# Patient Record
Sex: Female | Born: 1965 | Race: White | Hispanic: No | Marital: Married | Smoking: Never smoker
Health system: Southern US, Community
[De-identification: ages and names within clinical notes are randomized; demographics above are authoritative.]

## PROBLEM LIST (undated history)

## (undated) DIAGNOSIS — R109 Unspecified abdominal pain: Secondary | ICD-10-CM

## (undated) HISTORY — PX: ABDOMINAL HYSTERECTOMY: SHX81

## (undated) HISTORY — DX: Unspecified abdominal pain: R10.9

---

## 1998-09-12 ENCOUNTER — Other Ambulatory Visit: Admission: RE | Admit: 1998-09-12 | Discharge: 1998-09-12 | Payer: Self-pay | Admitting: Obstetrics and Gynecology

## 1999-09-14 ENCOUNTER — Other Ambulatory Visit: Admission: RE | Admit: 1999-09-14 | Discharge: 1999-09-14 | Payer: Self-pay | Admitting: Obstetrics and Gynecology

## 1999-12-12 ENCOUNTER — Other Ambulatory Visit: Admission: RE | Admit: 1999-12-12 | Discharge: 1999-12-12 | Payer: Self-pay | Admitting: Physical Therapy

## 1999-12-12 ENCOUNTER — Encounter (INDEPENDENT_AMBULATORY_CARE_PROVIDER_SITE_OTHER): Payer: Self-pay | Admitting: *Deleted

## 2000-01-31 ENCOUNTER — Ambulatory Visit (HOSPITAL_COMMUNITY): Admission: RE | Admit: 2000-01-31 | Discharge: 2000-01-31 | Payer: Self-pay | Admitting: Family Medicine

## 2000-01-31 ENCOUNTER — Encounter (INDEPENDENT_AMBULATORY_CARE_PROVIDER_SITE_OTHER): Payer: Self-pay | Admitting: Specialist

## 2000-07-16 ENCOUNTER — Observation Stay (HOSPITAL_COMMUNITY): Admission: RE | Admit: 2000-07-16 | Discharge: 2000-07-17 | Payer: Self-pay | Admitting: Obstetrics and Gynecology

## 2000-07-16 ENCOUNTER — Encounter (INDEPENDENT_AMBULATORY_CARE_PROVIDER_SITE_OTHER): Payer: Self-pay | Admitting: Specialist

## 2000-07-18 ENCOUNTER — Encounter: Payer: Self-pay | Admitting: Emergency Medicine

## 2000-07-18 ENCOUNTER — Emergency Department (HOSPITAL_COMMUNITY): Admission: EM | Admit: 2000-07-18 | Discharge: 2000-07-18 | Payer: Self-pay | Admitting: Emergency Medicine

## 2001-06-29 ENCOUNTER — Other Ambulatory Visit: Admission: RE | Admit: 2001-06-29 | Discharge: 2001-06-29 | Payer: Self-pay | Admitting: Obstetrics and Gynecology

## 2002-07-11 ENCOUNTER — Emergency Department (HOSPITAL_COMMUNITY): Admission: EM | Admit: 2002-07-11 | Discharge: 2002-07-12 | Payer: Self-pay | Admitting: *Deleted

## 2002-08-16 ENCOUNTER — Other Ambulatory Visit: Admission: RE | Admit: 2002-08-16 | Discharge: 2002-08-16 | Payer: Self-pay | Admitting: Obstetrics and Gynecology

## 2003-05-18 ENCOUNTER — Encounter: Admission: RE | Admit: 2003-05-18 | Discharge: 2003-05-18 | Payer: Self-pay | Admitting: General Surgery

## 2003-05-18 ENCOUNTER — Encounter: Payer: Self-pay | Admitting: General Surgery

## 2003-09-08 ENCOUNTER — Other Ambulatory Visit: Admission: RE | Admit: 2003-09-08 | Discharge: 2003-09-08 | Payer: Self-pay | Admitting: Obstetrics and Gynecology

## 2004-10-04 ENCOUNTER — Other Ambulatory Visit: Admission: RE | Admit: 2004-10-04 | Discharge: 2004-10-04 | Payer: Self-pay | Admitting: Obstetrics and Gynecology

## 2005-02-09 ENCOUNTER — Emergency Department (HOSPITAL_COMMUNITY): Admission: EM | Admit: 2005-02-09 | Discharge: 2005-02-09 | Payer: Self-pay | Admitting: Emergency Medicine

## 2005-03-21 ENCOUNTER — Ambulatory Visit (HOSPITAL_COMMUNITY): Admission: RE | Admit: 2005-03-21 | Discharge: 2005-03-21 | Payer: Self-pay | Admitting: Gastroenterology

## 2005-04-12 ENCOUNTER — Ambulatory Visit (HOSPITAL_BASED_OUTPATIENT_CLINIC_OR_DEPARTMENT_OTHER): Admission: RE | Admit: 2005-04-12 | Discharge: 2005-04-12 | Payer: Self-pay | Admitting: General Surgery

## 2005-08-28 ENCOUNTER — Emergency Department (HOSPITAL_COMMUNITY): Admission: EM | Admit: 2005-08-28 | Discharge: 2005-08-28 | Payer: Self-pay | Admitting: Emergency Medicine

## 2006-10-08 ENCOUNTER — Ambulatory Visit: Payer: Self-pay | Admitting: Family Medicine

## 2006-10-08 LAB — CONVERTED CEMR LAB
Albumin: 4.2 g/dL (ref 3.5–5.2)
Alkaline Phosphatase: 47 units/L (ref 39–117)
BUN: 15 mg/dL (ref 6–23)
Chloride: 102 meq/L (ref 96–112)
GFR calc non Af Amer: 85 mL/min
MCHC: 33.3 g/dL (ref 30.0–36.0)
MCV: 98.5 fL (ref 78.0–100.0)
Platelets: 436 10*3/uL — ABNORMAL HIGH (ref 150–400)
RBC: 4.23 M/uL (ref 3.87–5.11)
RDW: 12.2 % (ref 11.5–14.6)

## 2008-03-25 ENCOUNTER — Encounter: Admission: RE | Admit: 2008-03-25 | Discharge: 2008-03-25 | Payer: Self-pay | Admitting: Obstetrics and Gynecology

## 2009-03-27 ENCOUNTER — Encounter: Admission: RE | Admit: 2009-03-27 | Discharge: 2009-03-27 | Payer: Self-pay | Admitting: Obstetrics and Gynecology

## 2009-03-28 ENCOUNTER — Ambulatory Visit (HOSPITAL_COMMUNITY): Admission: RE | Admit: 2009-03-28 | Discharge: 2009-03-28 | Payer: Self-pay | Admitting: Surgery

## 2009-04-06 ENCOUNTER — Encounter: Admission: RE | Admit: 2009-04-06 | Discharge: 2009-04-06 | Payer: Self-pay | Admitting: Obstetrics and Gynecology

## 2009-06-05 ENCOUNTER — Ambulatory Visit: Payer: Self-pay | Admitting: Internal Medicine

## 2009-06-05 ENCOUNTER — Inpatient Hospital Stay (HOSPITAL_COMMUNITY): Admission: EM | Admit: 2009-06-05 | Discharge: 2009-06-07 | Payer: Self-pay | Admitting: Emergency Medicine

## 2009-08-21 ENCOUNTER — Ambulatory Visit: Payer: Self-pay | Admitting: Family Medicine

## 2009-08-21 DIAGNOSIS — R51 Headache: Secondary | ICD-10-CM

## 2009-08-21 DIAGNOSIS — R519 Headache, unspecified: Secondary | ICD-10-CM | POA: Insufficient documentation

## 2009-08-21 DIAGNOSIS — K270 Acute peptic ulcer, site unspecified, with hemorrhage: Secondary | ICD-10-CM | POA: Insufficient documentation

## 2009-08-21 DIAGNOSIS — K5730 Diverticulosis of large intestine without perforation or abscess without bleeding: Secondary | ICD-10-CM | POA: Insufficient documentation

## 2009-08-23 ENCOUNTER — Telehealth (INDEPENDENT_AMBULATORY_CARE_PROVIDER_SITE_OTHER): Payer: Self-pay | Admitting: *Deleted

## 2009-08-23 LAB — CONVERTED CEMR LAB
BUN: 17 mg/dL (ref 6–23)
Basophils Relative: 0.7 % (ref 0.0–3.0)
CO2: 30 meq/L (ref 19–32)
Calcium: 9.2 mg/dL (ref 8.4–10.5)
Chloride: 105 meq/L (ref 96–112)
Creatinine, Ser: 0.6 mg/dL (ref 0.4–1.2)
Eosinophils Absolute: 0.1 10*3/uL (ref 0.0–0.7)
Glucose, Bld: 89 mg/dL (ref 70–99)
HCT: 39.9 % (ref 36.0–46.0)
Hemoglobin: 13.6 g/dL (ref 12.0–15.0)
MCV: 99.7 fL (ref 78.0–100.0)
Neutrophils Relative %: 52 % (ref 43.0–77.0)
RBC: 4 M/uL (ref 3.87–5.11)
RDW: 12.4 % (ref 11.5–14.6)
WBC: 5 10*3/uL (ref 4.5–10.5)

## 2010-03-21 ENCOUNTER — Encounter: Payer: Self-pay | Admitting: Family Medicine

## 2010-03-21 ENCOUNTER — Encounter (INDEPENDENT_AMBULATORY_CARE_PROVIDER_SITE_OTHER): Payer: Self-pay | Admitting: *Deleted

## 2010-03-21 LAB — CONVERTED CEMR LAB
AST: 21 units/L
Albumin: 4.6 g/dL
BUN: 13 mg/dL
CO2, serum: 27 mmol/L
Chloride, Serum: 104 mmol/L
Creatinine, Ser: 0.7 mg/dL
HCT: 41.1 %
MCH: 32.7 pg
MCV: 98.5 fL
RBC count: 4.17 10*6/uL
Total Protein: 6.8 g/dL
WBC, blood: 6.3 10*3/uL

## 2010-03-23 ENCOUNTER — Encounter: Admission: RE | Admit: 2010-03-23 | Discharge: 2010-03-23 | Payer: Self-pay | Admitting: Gastroenterology

## 2010-03-29 ENCOUNTER — Encounter: Admission: RE | Admit: 2010-03-29 | Discharge: 2010-03-29 | Payer: Self-pay | Admitting: Obstetrics and Gynecology

## 2010-07-05 IMAGING — MG MM SCREEN MAMMOGRAM BILATERAL
4 series · 4 of 4 positions shown · non-contrast
Comparison: Prior studies.

DG SCREEN MAMMOGRAM BILATERAL
Bilateral CC and MLO view(s) were taken.

DIGITAL SCREENING MAMMOGRAM WITH CAD:

[R CC]
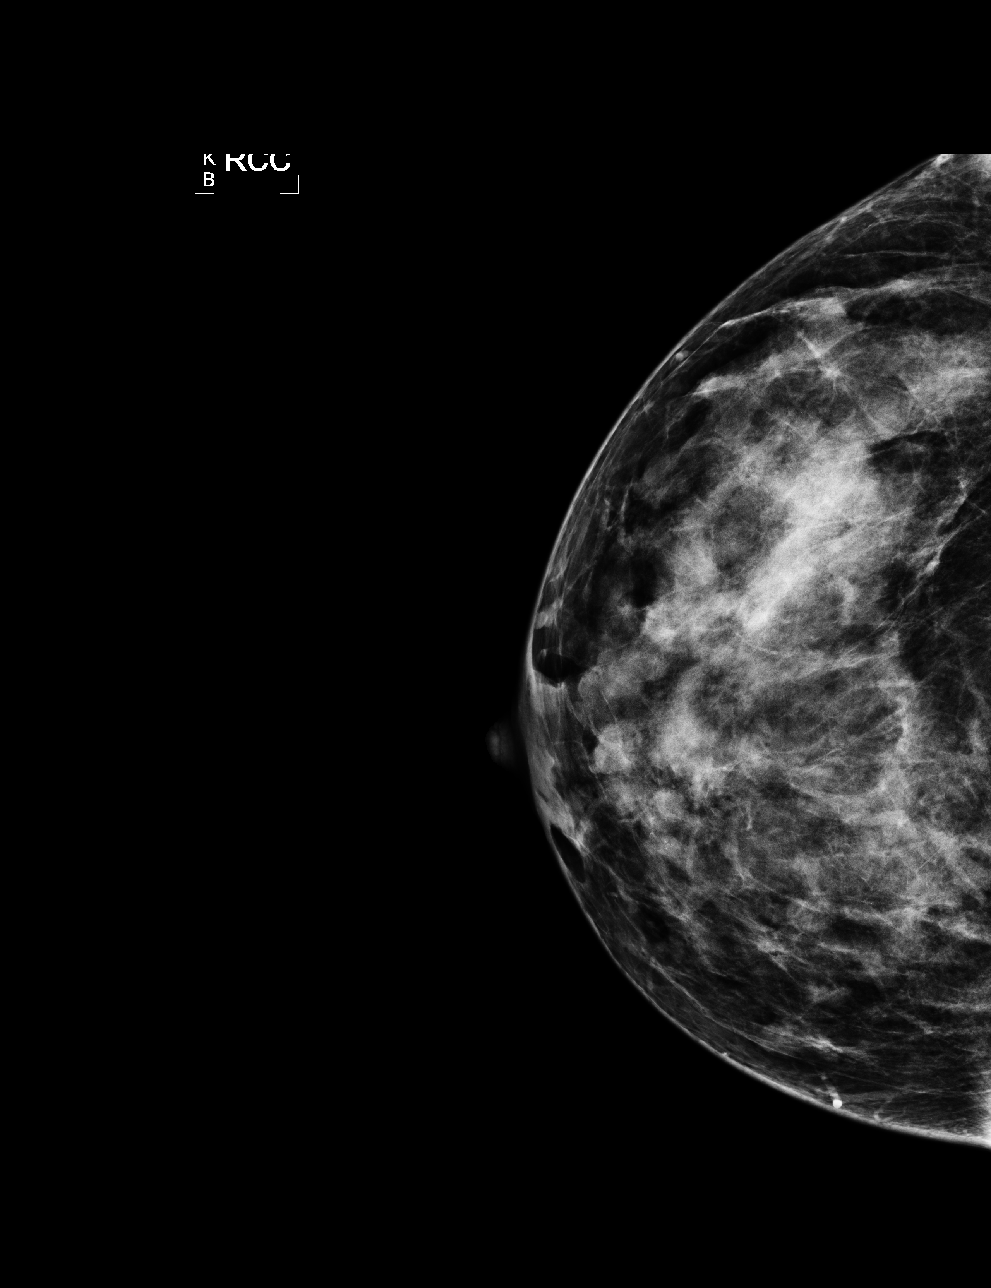

[L CC]
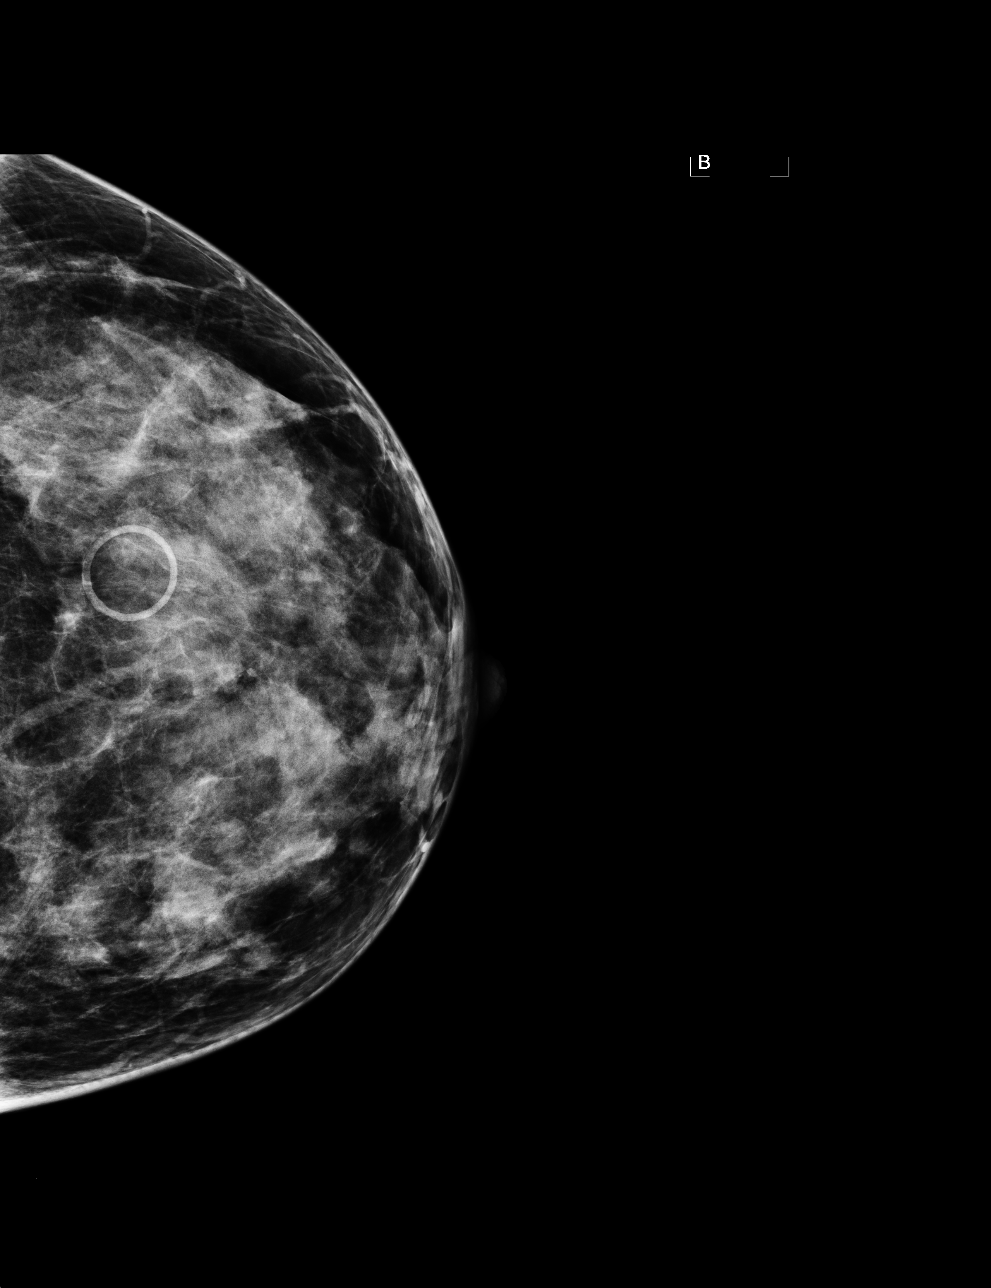

[L MLO]
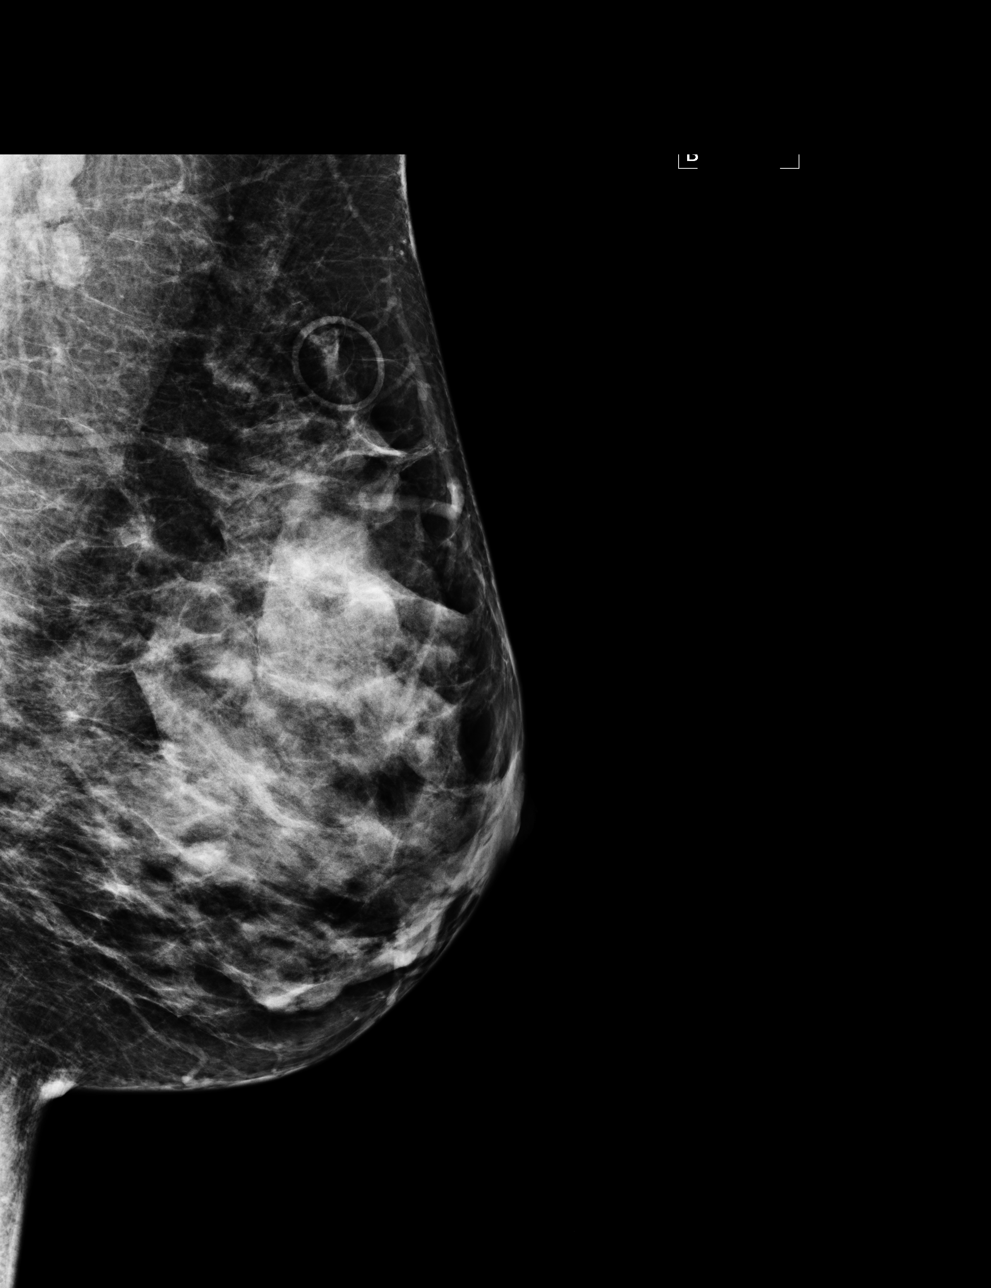

[R MLO]
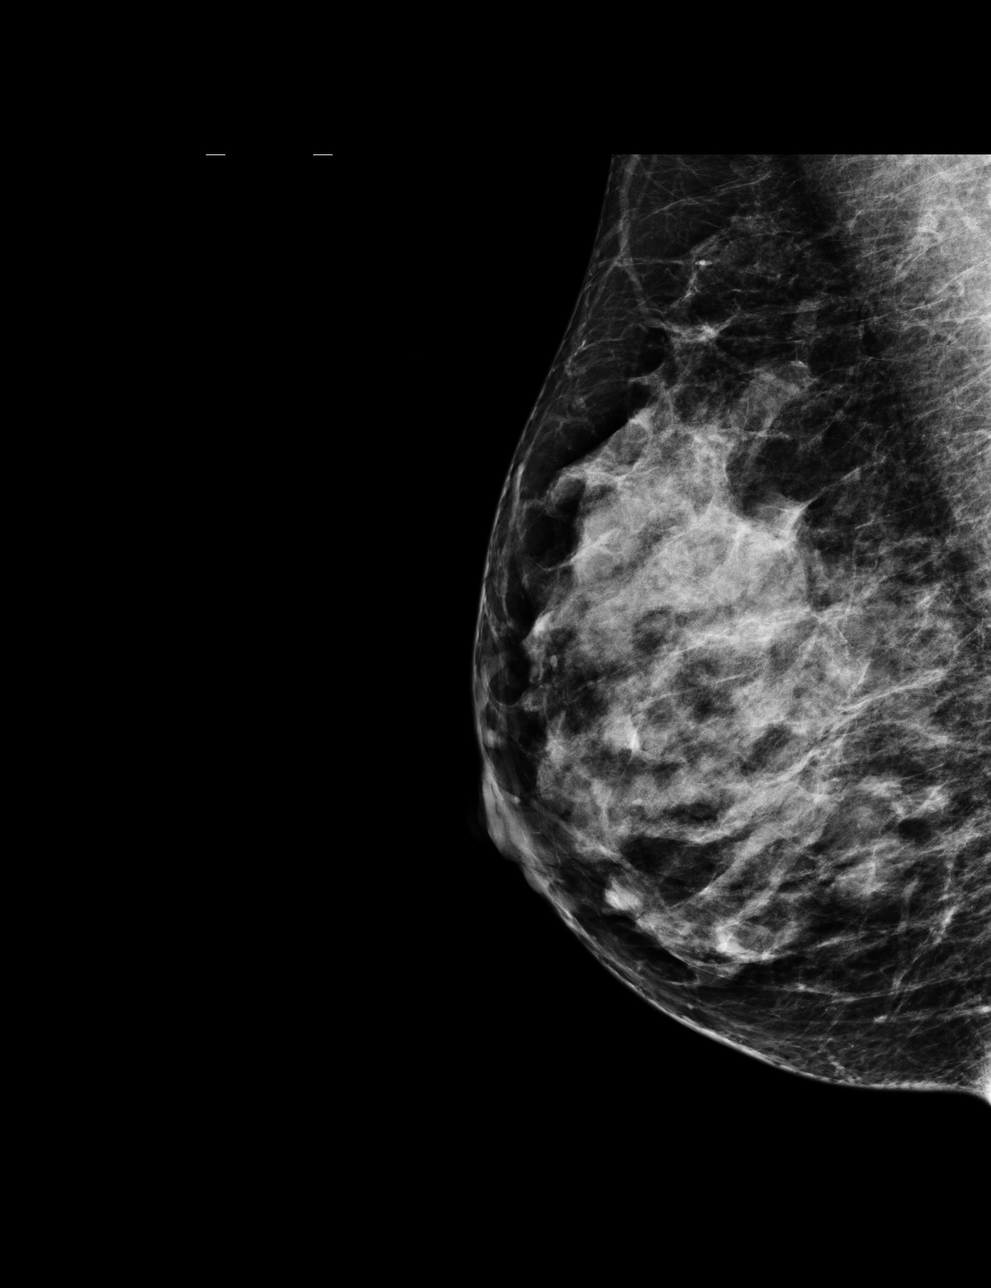

[4 of 4 positions shown; findings below may reference images not displayed]

The breast tissue is heterogeneously dense.  There is no dominant mass, architectural distortion or
calcification to suggest malignancy.  However, on her screening history sheet, the patient reports
a right axillary mass.
IMPRESSION: No mammographic evidence of malignancy.  However, the patient reports a right axillary mass.  She 
will be recalled for further evaluation

ASSESSMENT: Need additional imaging evaluation and/or prior mammograms for comparison - BI-RADS 0 -
Right

Further imaging of the right breast.
ANALYZED BY COMPUTER AIDED DETECTION. , THIS PROCEDURE WAS A DIGITAL MAMMOGRAM.

## 2011-01-24 NOTE — Letter (Signed)
Summary: [facility]  [facility]   Imported By: Lanelle Bal 03/28/2010 12:30:15  _____________________________________________________________________  External Attachment:    Type:   Image     Comment:   External Document

## 2011-01-24 NOTE — Miscellaneous (Signed)
Summary: lab from Dr. Ewing Schlein   Clinical Lists Changes  Observations: Added new observation of BILI TOTAL: 0.7 mg/dL (16/09/9603 54:09) Added new observation of SGPT (ALT): 17 units/L (03/21/2010 15:21) Added new observation of SGOT (AST): 21 units/L (03/21/2010 15:21) Added new observation of PROTEIN, TOT: 6.8 g/dL (81/19/1478 29:56) Added new observation of ALBUMIN: 4.6 g/dL (21/30/8657 84:69) Added new observation of CALCIUM: 9.5 mg/dL (62/95/2841 32:44) Added new observation of GLUCOSE SER: 96 mg/dL (12/25/7251 66:44) Added new observation of CREATININE: 0.70 mg/dL (03/47/4259 56:38) Added new observation of BUN: 13 mg/dL (75/64/3329 51:88) Added new observation of CO2 TOTAL: 27 mmol/L (03/21/2010 15:21) Added new observation of CHLORIDE: 104 mmol/L (03/21/2010 15:21) Added new observation of POTASSIUM: 4.3 mmol/L (03/21/2010 15:21) Added new observation of SODIUM: 138 mmol/L (03/21/2010 15:21) Added new observation of PLATELETS: 332 10*3/mm3 (03/21/2010 15:21) Added new observation of MCH: 32.7 pg (03/21/2010 15:21) Added new observation of MCV: 98.5 fL (03/21/2010 15:21) Added new observation of HCT: 41.1 % (03/21/2010 15:21) Added new observation of HGB: 13.4 g/dL (41/66/0630 16:01) Added new observation of RBC: 4.17 10*6/mm3 (03/21/2010 15:21) Added new observation of WBC: 6.3 10*3/mm3 (03/21/2010 15:21)

## 2011-04-01 LAB — DIFFERENTIAL
Basophils Absolute: 0 10*3/uL (ref 0.0–0.1)
Basophils Relative: 0 % (ref 0–1)
Eosinophils Relative: 2 % (ref 0–5)
Lymphocytes Relative: 32 % (ref 12–46)
Monocytes Absolute: 0.6 10*3/uL (ref 0.1–1.0)
Neutro Abs: 6.7 10*3/uL (ref 1.7–7.7)

## 2011-04-01 LAB — URINALYSIS, ROUTINE W REFLEX MICROSCOPIC
Ketones, ur: NEGATIVE mg/dL
Nitrite: NEGATIVE
Protein, ur: NEGATIVE mg/dL
Specific Gravity, Urine: 1.028 (ref 1.005–1.030)
Urobilinogen, UA: 0.2 mg/dL (ref 0.0–1.0)
pH: 5 (ref 5.0–8.0)

## 2011-04-01 LAB — VITAMIN B12: Vitamin B-12: 550 pg/mL (ref 211–911)

## 2011-04-01 LAB — BASIC METABOLIC PANEL
CO2: 27 mEq/L (ref 19–32)
CO2: 26 mEq/L (ref 19–32)
Calcium: 7.7 mg/dL — ABNORMAL LOW (ref 8.4–10.5)
Chloride: 110 mEq/L (ref 96–112)
Creatinine, Ser: 0.59 mg/dL (ref 0.4–1.2)
Creatinine, Ser: 0.58 mg/dL (ref 0.4–1.2)
GFR calc Af Amer: >60 mL/min (ref >60)
GFR calc non Af Amer: >60 mL/min (ref >60)
Glucose, Bld: 91 mg/dL (ref 70–99)
Sodium: 137 mEq/L (ref 135–145)

## 2011-04-01 LAB — VITAMIN D 1,25 DIHYDROXY
Vitamin D 1, 25 (OH)2 Total: 84 pg/mL — ABNORMAL HIGH (ref 18–72)
Vitamin D2 1, 25 (OH)2: <8 pg/mL
Vitamin D3 1, 25 (OH)2: 84 pg/mL

## 2011-04-01 LAB — COMPREHENSIVE METABOLIC PANEL
AST: 18 U/L (ref 0–37)
Albumin: 4.1 g/dL (ref 3.5–5.2)
Alkaline Phosphatase: 40 U/L (ref 39–117)
BUN: 16 mg/dL (ref 6–23)
CO2: 27 mEq/L (ref 19–32)
Chloride: 105 mEq/L (ref 96–112)
Creatinine, Ser: 0.68 mg/dL (ref 0.4–1.2)
GFR calc non Af Amer: >60 mL/min (ref >60)
Potassium: 4.3 mEq/L (ref 3.5–5.1)
Total Bilirubin: 0.5 mg/dL (ref 0.3–1.2)

## 2011-04-01 LAB — CBC
HCT: 34.9 % — ABNORMAL LOW (ref 36.0–46.0)
HCT: 39.7 % (ref 36.0–46.0)
Hemoglobin: 13.9 g/dL (ref 12.0–15.0)
MCHC: 34.3 g/dL (ref 30.0–36.0)
MCHC: 34.4 g/dL (ref 30.0–36.0)
MCHC: 35.1 g/dL (ref 30.0–36.0)
Platelets: 225 10*3/uL (ref 150–400)
RDW: 12.8 % (ref 11.5–15.5)
WBC: 5.9 10*3/uL (ref 4.0–10.5)
WBC: 11.1 10*3/uL — ABNORMAL HIGH (ref 4.0–10.5)

## 2011-04-01 LAB — GLIADIN ANTIBODIES, SERUM: Gliadin IgG: 0.7 U/mL (ref <7)

## 2011-04-01 LAB — LIPASE, BLOOD: Lipase: 22 U/L (ref 11–59)

## 2011-04-01 LAB — TSH: TSH: 1.365 u[IU]/mL (ref 0.350–4.500)

## 2011-04-01 LAB — H. PYLORI ANTIBODY, IGG: H Pylori IgG: <0.4 {ISR}

## 2011-04-03 LAB — HEMOGLOBIN AND HEMATOCRIT, BLOOD
HCT: 40 % (ref 36.0–46.0)
Hemoglobin: 13.5 g/dL (ref 12.0–15.0)

## 2011-05-07 NOTE — Consult Note (Signed)
NAMEWENDIE, DISKIN                ACCOUNT NO.:  [id number]   MEDICAL RECORD NO.:  [id number]          PATIENT TYPE:  INP   LOCATION:  1534                         FACILITY:  [facility]   PHYSICIAN:  Wilmon Arms. Corliss Skains, M.D. DATE OF BIRTH:  Oct 16, 1966   DATE OF CONSULTATION:  06/05/2009  DATE OF DISCHARGE:                                 CONSULTATION   REASON FOR CONSULTATION:  Possible partial small bowel obstruction.   HISTORY OF PRESENT ILLNESS:  This is a 45 year old female in good health  who presents with several days of worsening epigastric abdominal pain.  The patient has extensive past surgical history including a laparoscopic-  assisted sigmoid colectomy in 2006 for diverticulitis, an open ventral  hernia repair at her umbilical port site in 2006, and a laparoscopic  ventral hernia repair with mesh in April of 2010 by Dr. Michaell Cowing.  At that  time, he used a 15 x 20-cm sheet of mesh.  Since surgery she has had  pain in the upper part of her abdomen around the laparoscopic sites.  She has also been having some constipation.  However, over the last  several days, she has had worsening pain and distention.  Her last bowel  movement was yesterday.  She had 1 episode of vomiting today.  She does  feel bloated and distended.  The patient reports that she has not been  on narcotics for some time but has been taking larger than usual doses  of ibuprofen 600 mg every 5 or 6 hours for the last several weeks.  She  denies any hematemesis or hematochezia.  No melena.  She presented to  the emergency department for evaluation.  They obtained a CT scan which  is described below.   PAST MEDICAL HISTORY:  Diverticulitis.   PAST SURGICAL HISTORY:  1. D and C and diagnostic laparoscopy.  2. C-section, 1993.  3. Tubal ligation.  4. Laparoscopic sigmoid colectomy, 2006.  5. Ventral hernia repair, 2006.  6. Laparoscopic ventral hernia repair with mesh, 2010.   ALLERGIES:  1. PENICILLIN.  2.  CIPRO.   CURRENT MEDICATIONS:  None.   PHYSICAL EXAMINATION:  Temp 97.3.  Pulse 75.  Blood pressure 108/69.  This is a well-developed, well-nourished female in no apparent distress.  HEENT:  EOMI.  Sclerae icteric.  NECK:  No mass.  No thyromegaly.  LUNGS:  Clear.  Normal respiratory effort.  HEART:  Regular rate and rhythm.  No murmur.  ABDOMEN:  Positive bowel sounds.  Soft, mildly distended, tender in the  epigastrium.  No rebound or guarding.  No palpable masses.   LABS:  White count 11.1, hemoglobin 13.9.  Electrolytes within normal  limits.  Lipase is normal.  CT scan showed some mildly dilated proximal  small bowel with decompressed distal small bowel.  There is also  mesenteric edema.   IMPRESSION:  Early partial small bowel obstruction versus ileus  secondary to gastroenteritis.  Her pain ultimately may be secondary to  gastritis or peptic ulcer disease secondary to heavy recent NSAID use.   RECOMMENDATIONS:  1. Bowel rest.  2.  IV Protonix.  3. Carafate.  4. No need for NG tube at this time.  5. We will recheck labs and films in the morning.      Wilmon Arms. Tsuei, M.D.  Electronically Signed     MKT/MEDQ  D:  06/05/2009  T:  06/05/2009  Job:  [id number]   cc:   Rosalyn Gess. Norins, MD  [address]  [place]  [place] [address]   Ardeth Sportsman, MD  [address] [place] [address]

## 2011-05-07 NOTE — Discharge Summary (Signed)
Tara Banks, Tara Banks                ACCOUNT NO.:  [id number]   MEDICAL RECORD NO.:  [id number]          PATIENT TYPE:  INP   LOCATION:  1534                         FACILITY:  [facility]   PHYSICIAN:  Georgina Quint. Plotnikov, MDDATE OF BIRTH:  06-19-1966   DATE OF ADMISSION:  06/05/2009  DATE OF DISCHARGE:  06/07/2009                               DISCHARGE SUMMARY   DISCHARGE MEDICATIONS:  1. Protonix 40 mg one twice a day.  2. Tramadol 50 mg one to two p.o. b.i.d. p.r.n. pain.  3. Promethazine 25 mg one p.o. b.i.d. p.r.n. nausea.  4. Iron sulfate 325 mg one p.o. daily for 1 month.   FOLLOWUP PLANS:  1. Dr. Beverely Low in 3 - 7 days with labs prior (CBC).  2. Dr. Ewing Schlein in 1 - 2 weeks.   SPECIAL INSTRUCTIONS:  Call if problems.  Do not take ibuprofen or other  anti-inflammatory medications.   CONSULTATIONS:  Surgery - Dr. Corliss Skains and Dr. Michaell Cowing.   PROCEDURES:  CT scan of the abdomen.   HISTORY:  The patient is a 45 year old female who was admitted for  worsening epigastric abdominal pain.  The pain was severe.  She was  constipated and nauseated.  She was admitted for early stages of small  bowel obstruction.  For the details please address to our history and  physical and Dr. Gordy Savers and Dr. Fatima Sanger consultation note.  Of note,  the patient had a large dark stool a couple days prior to her admission  that she referred to red wine consumption on the day prior.   Of note, prior to admission she was consuming 9 - 12 Advil a day for  epigastric pain, thought to be a pain from mesh placement for hernia  repair.   HOSPITAL COURSE:  During the course of hospitalization the patient was  treated with IV fluids, we progressed with diet slowly.  She was seen in  consultation by Surgery.  She had to use an enema to relieve  constipation.  The first stool was very large and dark.  On the day of  discharge she is feeling much better.   She is afebrile, her blood pressure is 90/59, heart rate 70,  respirations 18, temperature 98.2, T-max 98.4, sats 100% on room air.  She is in no acute distress.  HEENT:  Moist mucosa.  NECK:  Supple.  LUNGS:  Clear.  HEART:  Regular.  ABDOMEN:  Soft, sensitive/tender in the epigastric area.  No masses, no  rebound symptoms.  LOWER EXTREMITIES:  Without edema.  SKIN:  Clear.  She is alert and oriented, cooperative.   DISCHARGE DIAGNOSES:  1. Abdominal pain/early small-bowel obstruction/ileus, likely due to      problem #2 and #3.  2. Probable gastritis versus steroid-induced ulcer with upper      gastrointestinal bleeding.  3. Status post multiple abdominal surgeries.  4. Mild anemia due to the above.  5. Left ovarian cyst with small to moderate pelvic fluid on CT scan.   DISCHARGE DIET:  Discussed with the patient:  eat small portions.  Fluids.  She may need  to try a gluten-free diet in view of her chronic  GI concerns.  She will discuss it further with Dr. Ewing Schlein.   LAB WORK:  CT of the abdomen with left ovarian cyst, small to moderate  pelvic fluid, SBO with possible enteritis.  Hemoglobin 10.9, was 13.9.  White cell count 6.7, was 11.1.  Platelets 225, sodium 137, potassium  3.8, creatinine 0.58.  Urine pregnancy test negative.  Liver function  tests, urinalysis normal.  Abdominal x-ray on June 15 with one loop of  slightly dilated bowel, otherwise improved from previous CAT scan.  A  number of tests including TSH, vitamin B12, vitamin D, antigliadin  antibodies, H. pylori are still pending.      Georgina Quint. Plotnikov, MD  Electronically Signed     AVP/MEDQ  D:  06/07/2009  T:  06/07/2009  Job:  [id number]   cc:   Neena Rhymes, M.D.   Petra Kuba, M.D.  Fax: [phone]   Lincoln Maxin  Fax: [phone]

## 2011-05-07 NOTE — Op Note (Signed)
Tara Banks, Tara Banks                ACCOUNT NO.:  [id number]   MEDICAL RECORD NO.:  [id number]          PATIENT TYPE:  AMB   LOCATION:  DAY                          FACILITY:  [facility]   PHYSICIAN:  Ardeth Sportsman, MD     DATE OF BIRTH:  Mar 16, 1966   DATE OF PROCEDURE:  DATE OF DISCHARGE:                               OPERATIVE REPORT   PRIMARY CARE PHYSICIAN:  Lenoard Aden, M.D.   GASTROENTEROLOGIST:  Petra Kuba, M.D.   SURGEON:  Ardeth Sportsman, MD.   ASSISTANT:  Felicity Pellegrini.   PREOPERATIVE DIAGNOSIS:  Recurrent ventral hernia.   POSTOPERATIVE DIAGNOSIS:  Recurrent ventral hernia.   PROCEDURES PERFORMED:  1. Laparoscopic lysis of adhesions.  2. Laparoscopic ventral hernia repair with primary laparoscopic      closure and underlay repair with mesh.   ANESTHESIA:  1. General anesthesia.  2. Local anesthetic and a field block around port sites.   SPECIMENS:  None.   DRAINS:  None.   ESTIMATED BLOOD LOSS:  5 mL.   COMPLICATIONS:  None apparent.   INDICATIONS:  Ms. Berko is a pleasant 42-year female who developed a  periumbilical incisional hernia from a partial colectomy for  diverticulitis.  She had a primary repair done by Dr. Lurene Shadow in our  group in 2007.  She has developed a recurrence.  She had CT scan showing  small bowel herniating up into the area.  Based on concern, surgical  consultation was requested and repair was requested.   Anatomy and embryology of abdominal formation was discussed.  Pathophysiology of herniation with its natural history and risks were  discussed.  Options discussed and recommendations made for diagnostic  laparoscopy with lysis of adhesions and a ventral hernia repair with  probable primary closure then underlay mesh repair.  Risks, benefits,  alternatives, questions answered and she has agreed to see.   OPERATIVE FINDINGS:  She had a 4- x 5-cm periumbilical hernia.  I saw  old stitch there, but no definite mesh could be seen  from the peritoneal  side.  There was omentum within it and a loop of small bowel nearby, but  no incarceration.  The rest of the abdominal cavity appeared to be  normal.   DESCRIPTION OF PROCEDURE:  Informed consent was confirmed.  The patient  voided just prior to coming to the operating room.  She had sequential  pressure devices active during the entire case.  She received IV  antibiotics just prior to surgery.  She underwent general anesthesia  without any difficulty.  She was positioned supine with both arms tucked  on a foot board.  Her abdomen was prepped and draped in sterile fashion.   A 5-mm port was placed in the left upper quadrant using optical entry  technique with the patient in steep reverse Trendelenburg and left side  up.  Under direct visualization, 5-mm ports were placed in the right  flank and left lower quadrant.  A 12-mm port was placed in the left  flank.   Diagnostic laparoscopy revealed findings noted above.  Lambert Mody  cold  scissors careful blunt dissection were used help free the omental  attachments on the anterior abdominal wall and expose the hernia defect.  The mesh was measured.   The defect was closed using 0 Vicryl interrupted stitches x5 in a  transverse fashion.  These were tied down with releasing the  capnoperitoneum to help tie them down without tension and reinsufflated.  This helped provide good primary transverse closure.   Given the mesh options, ideally 15- x 15-cm mesh would be used but there  was done available so I chose a 15- x 20-cm mesh, placing 12 #1 Novofil  stitches on the rough side of the mesh around its borders.  The mesh was  rolled up and placed through the 12-cm left flank fascial defect and  unrolled.  It was tacked to the anterior abdominal wall such that the  ellipse was vertical and came suprapubically on the most inferior  aspect.  The laparoscopic suture passer was used to do transfascial  fixation x12.  The fascial  stitches were tied down, and reinsufflation  provided good overlapping mesh coverage.  Tacker was used to tack the  rough edges of the mesh peripherally and a couple centrally as well.   Camera inspection revealed no intra-abdominal injury with excellent  coverage.  The 12-mm fascial defect was reapproximated using 0 Vicryl  stitch using laparoscopic suture passer to good result.  Capnoperitoneum  was evacuated and ports removed.  Fascial stitches on the port site was  closed down.  Skin was closed using 4-0 Monocryl stitch.  The smaller  puncture sites for the transfascial stitches were closed using Tegaderm  and Steri-Strips.   The patient was extubated and sent to the recovery room in stable  condition.   I explained postoperative care with the patient in detail just prior to  surgery as well as discussed it in the office when I first met her.  I  am about to discuss it with her family as well.      Ardeth Sportsman, MD  Electronically Signed     SCG/MEDQ  D:  03/28/2009  T:  03/28/2009  Job:  [id number]   cc:   Lenoard Aden, M.D.  Fax: [phone]   Petra Kuba, M.D.  Fax: [phone]

## 2011-05-10 NOTE — Op Note (Signed)
Tara Banks, Tara Banks                ACCOUNT NO.:  [id number]   MEDICAL RECORD NO.:  [id number]          PATIENT TYPE:  AMB   LOCATION:  DSC                          FACILITY:  [facility]   PHYSICIAN:  Leonie Man, M.D.   DATE OF BIRTH:  12/11/1966   DATE OF PROCEDURE:  04/12/2005  DATE OF DISCHARGE:  04/12/2005                                 OPERATIVE REPORT   PREOPERATIVE DIAGNOSIS:  Incisional hernia.   POSTOPERATIVE DIAGNOSIS:  Incisional hernia.   PROCEDURE:  Repair of umbilical incisional hernia with mesh.   SURGEON:  Leonie Man, M.D.   ASSISTANT:  OR RN.   ANESTHESIA:  General.   SPECIMENS:  No specimens sent to the lab.   ESTIMATED BLOOD LOSS:  Minimal.   COMPLICATIONS:  No operative complications.   INDICATION:  The patient is a 45 year old woman who underwent a laparoscopic  sigmoid colectomy at [facility] by Dr. Lawanda Cousins.  She has subsequently  developed a trocar site hernia at the umbilical site.  She comes now to the  operating room for repair this hernia.  The risks and potential benefits of  surgery have been fully discussed with the patient, all questions answered  and consent obtained.   PROCEDURE:  With the patient positioned supinely, the abdomen is prepped and  draped routinely.  The infraumbilical incision is carried down around the  umbilicus and the old scar cicatrix; this is deepened through the skin and  subcutaneous tissue and elevating the umbilicus cephalad.  The incarcerated  mass is dissected free from off of the posterior wall of the posterior  umbilical skin and amputated and discarded.  The hernial defect is then  freed from all adhesions and a Bard mesh plug is placed into the defect and  sutured down to the surrounding fascia with interrupted 0 Novofil sutures.  Repair is noted to be intact.  Sponge and instrument counts verified, the  subcutaneous tissues are closed with interrupted 2-0 Vicryl sutures, the  skin closed with a 4-0  Monocryl suture and then reinforced with Steri-  Strips, sterile dressings applied, anesthetic reversed, patient removed from  the operating room to the recovery room in stable condition.  She tolerated  the procedure well.    PB/MEDQ  D:  05/15/2005  T:  05/16/2005  Job:  [id number]

## 2011-05-10 NOTE — H&P (Signed)
[facility]  Patient:    Tara Banks, Tara Banks                         MRN: [id number] Adm. Date:  07/15/00 Attending:  Trevor Iha, M.D.                         History and Physical  DATE OF BIRTH:  [birth year]  HISTORY OF PRESENT ILLNESS:  The patient is a 45 year old gravida 3, para 3, with worsening pelvic pain and dyspareunia.  It is not relieved with anti-inflammatory medication.  She describes cramping at all times and chronic pelvic pain at all times, worse with intercourse.  Again, it is not improved with birth control pills or anti-inflammatory medication.  She presently has undergone laparoscopy without improvement.  The patient also has had evaluation by gastroenterologist without significant benefit.  She presents today for laparoscopically assisted vaginal hysterectomy.  She had no future child bearing plans.  She has undergone sterilization as had her husband.  She does have minimal stress urinary incontinence and minimal pelvic relaxation symptoms.  PAST MEDICAL HISTORY:  Negative.  PAST SURGICAL HISTORY:  Significant for cesarean section in 1993, a D&C in 1985, laparoscopy in December of 2000, and a postpartum tubal after her last delivery.  MEDICATIONS:  At this time, she is on no medications other anti-inflammatory medication.  ALLERGIES:  SHE HAS SENSITIVITY TO PENICILLIN WHICH CAUSES VOMITING.  PHYSICAL EXAMINATION:  VITAL SIGNS:  Blood pressure is 98/52, weight is 114.  HEART:  Regular rate and rhythm.  LUNGS:  Clear to auscultation bilaterally.  ABDOMEN:  Nondistended and nontender.  Uterus is anteverted, mobile, slightly increased in size.  Moderately tender to deep palpation.  No adnexal masses are palpable.  Ultrasound in December showed 7.9 cm uterus, normal appearing ovaries. Laparoscopy in December showed a somewhat boggy appearing uterus, normal appearing tubes with the exception of a tubal ligation.  Normal  right and left ovaries.  Normal uterosacral ligaments no obvious endometriosis.  Again, the _________ at that time was some colonic nodules which later returned to be polyps, and somewhat enlarged boggy uterus.  IMPRESSION AND PLAN:  Pelvic pain and dyspareunia, improved with conservative medical management including anti-inflammatory medications, oral contraceptive agents with a laparoscopy for evaluation by colonoscopy.  The patient desires to do the surgical intervention and request hysterectomy.  Discussed risks and benefits at length including, but not limited to risks of infection, bleeding, damage to bowel, bladder, and ovaries, the possibility of not able to alleviate the pain.  I had an extensive discussion with the patient with regards to bladder repair types of surgeries, and at this time, the patient does not desire any kind of bladder procedure, and describes her urinary incontinence as very mild.  Again, we will plan laparoscopically assisted vaginal hysterectomy.  Informed consent was obtained. DD:  07/15/00 TD:  07/15/00 Job: [id number]

## 2011-05-10 NOTE — Discharge Summary (Signed)
[facility]  Patient:    Tara Banks, Tara Banks                         MRN: [id number] Adm. Date:  [id number] Disc. Date: [id number] Attending:  Trevor Iha                           Discharge Summary  HISTORY OF PRESENT ILLNESS:  Ms. Michelin is a 45 year old with worsening dyspareunia and pelvic pain, not responsive to conservative medical management including oral contraceptive agents, anti-inflammatory medications, laparoscopy or even GI evaluation.  She presents for laparoscopically assisted vaginal hysterectomy.  Risks and benefits were discussed at length and informed consent was obtained for this.  See history and physical for further details.  HOSPITAL COURSE:  Patient was admitted the same day of surgery and underwent a laparoscopically assisted vaginal hysterectomy.  The hysterectomy was uncomplicated.  Estimated blood loss during the procedure was 200 cc.  The findings at the time of surgery were a slightly enlarged uterus which was boggy in nature; otherwise, normal-appearing ovaries, tubes, appendix and gallbladder.  Postoperative course was complicated only by borderline low blood pressures which responded to IV fluids and discontinuing the morphine. Postoperative day #1, she did have an allergic reaction to Percocet with generalized malaise and itching that responded to Benadryl but after discontinuing the Percocet, patient was ambulated without difficulty and tolerated a regular diet.  Physical exam was consistent with clean, dry and intact incisions, bowel sounds were positive and because the patient was doing well, we went ahead and discharged her home.  DISPOSITION:  Patient will be discharged home with followup in the office in two weeks.  DISCHARGE INSTRUCTIONS:  She was given a prescription for Darvocet-N, #30; she will also continue the Anaprox double-strength at home and return for any increased vaginal bleeding, worsening of her  pain or if she feels like she is not improving like she should.  DISCHARGE DIAGNOSES: 1. Dyspareunia and pelvic pain, pathology is current pending. 2. Status post laparoscopically assisted vaginal hysterectomy.  DISCHARGE CONDITION:  Good. DD:  07/17/00 TD:  07/18/00 Job: [id number]

## 2011-05-10 NOTE — Op Note (Signed)
[name], Tara Banks                ACCOUNT NO.:  [id number]   MEDICAL RECORD NO.:  [id number]          PATIENT TYPE:  AMB   LOCATION:  ENDO                         FACILITY:  [facility]   PHYSICIAN:  Petra Kuba, M.D.    DATE OF BIRTH:  02/13/1966   DATE OF PROCEDURE:  03/21/2005  DATE OF DISCHARGE:                                 OPERATIVE REPORT   PROCEDURE:  Colonoscopy.   ENDOSCOPIST:  Petra Kuba, M.D.   INDICATIONS:  Abdominal pain, history of previous surgery, abnormal CAT  scan.  Consent was signed after risks, benefits, methods, and options were  thoroughly discussed in the office in the past.   MEDICINES USED:  Demerol 80, Versed 8.   PROCEDURE:  Rectal inspection is pertinent for external hemorrhoids, small.  Digital exam was negative. The video pediatric adjustable colonoscope was  inserted and easily advanced around the colon to the cecum and advanced into  a normal terminal ileum.  The cecum was identified by the appendiceal  orifice and ileocecal valve.  Photo documentation was obtained.  To advance  this far did not require any abdominal pressure or any position changes. No  abnormality was seen on insertion. The scope was slowly withdrawn.  The prep  was adequate.  There was minimal liquid stool that required washing and  suction,  On slow withdrawal through the colon, there was a rare left-sided  early diverticula but no polyps, tumors, masses, or other abnormalities.  She did have a fairly low sigmoid anastomosis which was normal.  The scope  was further withdrawn.  Anorectal pull-through and retroflexion in the  rectum were pertinent for some internal hemorrhoids, small.  The scope was  straightened a re-advanced a short ways up the left side of the colon.  Air  was suctioned and the scope removed. The patient tolerated the procedure  well.  There was no obvious immediate complication.   ENDOSCOPIC DIAGNOSIS:  1.  Internal and external hemorrhoids.  2.   Sigmoid anastomosis.  3.  Tiny early rare left-sided diverticula.  4.  Otherwise, within normal limits to the terminal ileum.   PLAN:  Happy to see back p.r.n., repeat screening at age 89, return care to  Dr. Rana Snare and Dr. Lurene Shadow for further workup and plan.      MEM/MEDQ  D:  03/21/2005  T:  03/21/2005  Job:  [id number]   cc:   Dineen Kid. Rana Snare, M.D.  [address]  [place]  [place] [address]  Fax: [phone]   Leonie Man, M.D.  [address]  [address]  [place]  [place] [address]

## 2011-05-10 NOTE — Op Note (Signed)
[facility]  Patient:    Tara Banks, Tara Banks                         MRN: [id number] Proc. Date: 07/16/00 Adm. Date:  [id number] Disc. Date: [id number] Attending:  Trevor Iha                           Operative Report  PREOPERATIVE DIAGNOSIS:  Dyspareunia, pelvic pain.  POSTOPERATIVE DIAGNOSIS:  Dyspareunia, pelvic pain.  OPERATION PERFORMED:  Laparoscopically assisted vaginal hysterectomy.  SURGEON:  Trevor Iha, M.D.  ASSISTANT:  Freddy Finner, M.D.  ANESTHESIA:  General endotracheal.  INDICATIONS FOR PROCEDURE:  Tara Banks is a 45 year old G3, P3 with chronic pelvic pain not responsive to conservative medical management, no improvement with oral contraceptive agents, inflammatory medications.  No benefit after a laparoscopy or GI evaluation by colonoscopy.  Desires definitive surgical intervention with gross hysterectomy.  Both she and her husband are already sterilely, so no future child-rearing desirous.  PLAN:   Laparoscopically assisted vaginal hysterectomy and preservation of the ovaries.  The risks and benefits were discussed.  Informed consent was obtained.  FINDINGS:  Slightly enlarged, boggy appearing uterus.  Normal-appearing ovaries, tubes, appendix and gallbladder and liver.  Otherwise no evidence of endometriosis.  ESTIMATED BLOOD LOSS:  200 cc.  DESCRIPTION OF PROCEDURE:  After adequate general anesthesia, the patient was placed in dorsal lithotomy position.  She was sterilely prepped and draped. The bladder was sterilely drained.  A 1 cm infraumbilical skin incision was made.  Veress needle was inserted.  The abdomen was insufflated to dullness to percussion.  Two 5 mm ports were placed to the left and right of midline through a previous Pfannenstiel skin incision under direct visualization and the above findings were noted.  A 5 mm camera was placed through 5 mm ports and an Endo GIA was placed across the left  utero-ovarian ligaments with good placement noted.  The second application of the Endo GIA on the left side cut and stapled the utero-ovarian ligament down across the round ligaments.  This was done similarly on the right side with two applications of the Endo GIA with good placement noted by camera and the ovaries had fallen to the side with good hemostasis and was taken down past the round ligament.  At this time the legs were elevated for the vaginal portion of the procedure.  The cervix was grasped with Tara Banks tenaculum.  A posterior colpotomy was performed and the cervix was circumscribed with Bovie cautery.  The uterosacral ligaments were clamped, cut and tied bilaterally using 0 Monocryl sutures.  Bladder pillars were then clamped, cut and tied bilaterally and using 0 Monocryl throughout the procedure.  At this time the bladder was dissected off the anterior surface of the cervix.  The anterior peritoneum was entered and placed behind the Deaver retractor. Successive bites across the cardinal ligaments, across the uterine vascular up to the round ligament, bilateral with Heaney clamps and 0 Monocryl.  At this time the uterine fundus was grasped with a thyroid tenaculum and delivered into the introitus and Heaney clamps were placed across the remaining portion of the broad ligament, cut.  The uterus was then removed, sent to pathology and the remaining pedicles were doubly ligated with 0 Monocryl.  Good hemostasis was achieved. At this point the wet pack was placed.  The uterosacral ligaments were identified and suture ligated  bilaterally.  The posterior peritoneum was then closed with a pursestring suture of 0 Monocryl and the vagina was closed in a vertical fashion with figure-of-eights of 0 Monocryl with plication of the uterosacral ligament to the midline.  The anterior vagina was then closed after the packing was removed, again in a vertical fashion with figure-of-eights and 0  Monocryl with good approximation and good hemostasis. At this time the Foley catheter was reinserted with clear urine returned. Re-examination by laparoscope revealed good hemostasis.  Irrigation was applied with a Nezhat suction irrigator.  Some small areas of peritoneal bleeding were cauterized with bipolar cautery and after hemostasis was reassured, the trocars were removed, the abdomen desufflated.  The infraumbilical skin incision was closed with 0 Vicryl on the fascia in a figure-of-eight then a 3-0 Vicryl rapide subcuticular stitch.  The 5 mm port sites were closed with 3-0 Vicryl rapide subcuticular sutures.  The incisions were injected with 0.25% Marcaine.  The patient tolerated the procedure well, was stable on transfer to recovery and the patient received 1 gm of Cefotetan preoperatively.  Sponge and instrument counts were normal x 3.  Estimated blood loss was 200 cc. DD:  07/16/00 TD:  07/18/00 Job: [id number]

## 2011-05-29 ENCOUNTER — Ambulatory Visit: Payer: Self-pay | Admitting: Family Medicine

## 2011-09-18 ENCOUNTER — Other Ambulatory Visit: Payer: Self-pay | Admitting: Obstetrics and Gynecology

## 2011-09-18 DIAGNOSIS — Z1231 Encounter for screening mammogram for malignant neoplasm of breast: Secondary | ICD-10-CM

## 2011-09-26 ENCOUNTER — Ambulatory Visit
Admission: RE | Admit: 2011-09-26 | Discharge: 2011-09-26 | Disposition: A | Discharge status: Home / Self Care | Payer: BC Managed Care – PPO | Source: Ambulatory Visit | Attending: Obstetrics and Gynecology | Admitting: Obstetrics and Gynecology

## 2011-09-26 DIAGNOSIS — Z1231 Encounter for screening mammogram for malignant neoplasm of breast: Secondary | ICD-10-CM

## 2011-11-11 ENCOUNTER — Telehealth: Payer: Self-pay | Admitting: *Deleted

## 2011-11-11 NOTE — Telephone Encounter (Signed)
Yes, can switch to 1 tab twice daily x5 days rather than once daily x10

## 2011-11-11 NOTE — Telephone Encounter (Signed)
Triage Record Num: [id number] Operator: Chevis Pretty Patient Name: Tara Banks Call Date & Time: 11/09/2011 3:29:24PM Patient Phone: [phone] PCP: Lezlie Octave Patient Gender: Female PCP Fax : [phone] Patient DOB: 11/20/1966 Practice Name: [facility] Reason for Call: Caller: Oliviarose/Patient; PCP: Sheliah Hatch.; CB#: [phone]; Call Reason: Exposure To Flu; Sx Onset: 11/09/2011; Sx Notes: ; Afebrile; Wt: ; Guideline Used: ; Disp:; Appt Scheduled?: States son was seen at [facility] 11/09/11 and diagnosed with positive influenza A. Child was prescribed TamiFlu, and provider suggested family call PCP for coverage with TamiFlu. Denies current medications, allergies, or s/sx influenza. TC to Dr. Sharen Hones; Rx for Tamiflu 75 mg 1 po qd x 10 days called in to [facility]/[address] [phone]. Protocol(s) Used: Flu-Like Symptoms Recommended Outcome per Protocol: Provide Home/Self Care Reason for Outcome: Has questions/concerns about exposure to influenza Care Advice: ~ HEALTH PROMOTION / MAINTENANCE Influenza - Expected Course: - Symptoms start to improve in 3 to 7 days. - Cough and feeling tired (malaise) may continue for several weeks. - Cough and extreme fatigue lasting more than 3 weeks needs medical evaluation. - Remain at home at least 24 hours after being free of fever 100F (37.8C) without the use of fever reducing medication. ~ Influenza - Transmission: - Flu virus is spread through the air in droplets when a flu-infected person coughs, sneezes or talks and another person breathes in the droplets, or by touching a surface like a door knob, telephone, or keyboard that has been contaminated by the droplets and then touching the mouth, nose, or eyes. - An individual may be passing on the flu before they have any symptoms. - An individual may continue to be contagious with the flu for up to 7 days after the symptoms start. H1N1 influenza may  continue to be contagious as long as cough persists. ~ Influenza Prevention - Good Health Habits: - Practice good health habits: Get 7-8 hours of sleep each night. Eat healthy foods and drink sugar-free fluids. Exercise most days of a week and manage your stress. - Practice prevention by covering your mouth and nose with a tissue when sneezing or coughing and throwing the tissue into the trash after one use. Wash your hands often or use an alcohol-based gel or wipe. Avoid touching your eyes, nose and mouth. - Be sure to keep your immunizations up to date. - Avoid crowds during peak flu season. - Stay at home when not feeling well and avoid close contact with people who are sick. ~ Influenza Prevention - Personal Hygiene: - Wash your hands with soap and water often, for at least 15 seconds, especially after you cough or sneeze or when you have touched a contaminated surface/object (door knob, telephone, etc.) - If soap and water are not available, use an alcohol-based hand cleaner containing at least 60% alcohol, rub hands together until hands are dry. - Avoid putting your hands and fingers to the face, nose, mouth or eyes. - During the flu season avoid crowded places (shopping areas, planes, sporting events). ~ 11/09/2011 3:55:07PM Page 1 of 2 [id number] Call-A-Nurse Triage Call Report Patient Name: [name] continuation page/s Do not go to work, school, or any community activity until you have not had a fever (100F or 37.8C) for at least 24 hours without taking fever-reducing medication. This means staying at home for at least 3 to 5, possibly 7 days. ~ To help prevent a widespread community outbreak of the flu, call the call center, your  clinic or provider's office to discuss any questions or concerns before going in unless you have severe respiratory or any nervous system symptoms. ~ Influenza - Face Masks Disposable face masks should be used by: - People with flu-like  symptoms when in close contact with others in the home, if breastfeeding a baby, or when leaving the home for medical care - People at high risk for flu-related complications * Disposable face masks are available at pharmacies and hardware or building supply stores * Used face masks can be thrown away in the regular trash. Wash hands with soap and water or with alcohol-based gel after removing a face mask. ~ Antiviral medications - Prescribed medications are available in pills, liquids or inhaled powder that help prevent or lessen symptoms of viral illnesses. Antivirals are usually given to persons at a higher risk for flu-related complications or who are very ill. Most healthy people do not need to be treated with antiviral drugs. - Recommended medications for this season: oseltamivir (Tamiflu) and zanamivir (Relenza). - Work best when started within the first two days of symptoms. - Speak with your provider as soon as possible if exposed to the flu or if you have new symptoms of the flu. ~ Influenza Prevention - Immunization: * Vaccination each season is the best protection against getting the flu and its complications. The most common complication of influenza is pneumonia. * CDC recommends annual flu vaccine for everyone 6 months and older. Flu viruses change quickly so last year's vaccine may not protect you from this year's virus. * Flu vaccine is available in two forms, a shot or a nasal spray. - The shot contains killed viruses so you can't pass the flu to anyone else. It is approved for everyone age 86 months and older. - The nasal spray contains weakened live flu viruses that won't give you the flu but in rare cases can be passed to others. It is approved for healthy persons ages 2 to 39 years. It should NOT be given to pregnant women, those with a chronic medical condition, a weakened immune system, or to children and adolescents receiving aspirin therapy. * DO NOT get a flu shot  if you: - Have had a severe allergic reaction to the vaccine in the past - Have an allergy to chicken eggs - Have a fever that day * Full effect of the vaccination takes two weeks. If you are exposed to the flu virus during the two weeks you may catch the flu. If this year's vaccine does not match the flu viruses you are exposed to during this flu season, the flu shot won't protect you. ~ 11/

## 2011-11-11 NOTE — Telephone Encounter (Signed)
Pt left VM that she was given Tamiflu once a day as precaution since her son was Dx with the flu. Pt now has a fever and is feeling bad and would like to know if she needs to start taking med 2 times a day a day now since she is now symptomatic. Pt note that this is how son is taking med.

## 2011-11-11 NOTE — Telephone Encounter (Signed)
Pt aware.

## 2011-11-11 NOTE — Telephone Encounter (Signed)
Agree w/ Tamiflu.

## 2012-02-17 ENCOUNTER — Ambulatory Visit (INDEPENDENT_AMBULATORY_CARE_PROVIDER_SITE_OTHER): Payer: BC Managed Care – PPO | Admitting: Internal Medicine

## 2012-02-17 VITALS — BP 102/72 | HR 77 | Temp 98.2°F | Wt 114.0 lb

## 2012-02-17 DIAGNOSIS — R05 Cough: Secondary | ICD-10-CM | POA: Insufficient documentation

## 2012-02-17 DIAGNOSIS — R059 Cough, unspecified: Secondary | ICD-10-CM

## 2012-02-17 MED ORDER — HYDROCOD POLST-CHLORPHEN POLST 10-8 MG/5ML PO LQCR: 5.0000 mL | Freq: Two times a day (BID) | ORAL | Status: DC

## 2012-02-17 MED ORDER — AZITHROMYCIN 250 MG PO TABS: ORAL_TABLET | ORAL | Status: AC

## 2012-02-17 NOTE — Assessment & Plan Note (Signed)
Patient is here due to the intense  Cough, having a hard time sleeping at  Night. exam is actually benign. This could be a viral versus atypical infection. We'll try conservative treatment first, if not better will need Zithromax. See instructions.

## 2012-02-17 NOTE — Progress Notes (Signed)
  Subjective:    Patient ID: Tara Banks, female    DOB: 08-28-1966, 46 y.o.   MRN: [id number]  HPI  Past Medical History: Peptic Ulcer Diverticulosis, colon  PSHtonsilectomy Hernia repair , umbilical Hysterectomy, no oophorectomy  Review of Systems     Objective:   Physical Exam        Assessment & Plan:

## 2012-02-17 NOTE — Patient Instructions (Signed)
Rest, fluids , tylenol For cough, take Mucinex DM twice a day as needed  For persistent cough use tussionex as needed, will cause drowsiness Take the antibiotic as prescribed , Zithromax only if not better in few days  Call if no better in few days Call anytime if the symptoms are severe

## 2012-02-17 NOTE — Progress Notes (Signed)
  Subjective:    Patient ID: Tara Banks, female    DOB: [birth year], 46 y.o.   MRN: [id number]  HPI Acute visit  Developed mild chest congestion , HA few days ago; symptoms then included cough and are gradually increase. Yesterday she had subjective fever and chills. She is taking ibuprofen. She is here today  because her cough is very intense and is causing back pain.   Past medical history Peptic ulcer disease Diverticulosis  Past surgical history Tonsillectomy Hysterectomy, no oophorectomy Umbilical hernia repair  Review of Systems No actual sore throat or runny nose, the "glands" on the side of the neck hurts. Denies actual sinus discharge or congestion. No chest congestion. She had mild nausea and diarrhea twice with the onset of his symptoms. Denies GERD symptoms or wheezing    Objective:   Physical Exam Alert, oriented x3, motor and distress. Neck: Full range of motion, no LADs HEENT: Ears normal, nose not congested, asymmetric and not tender. Throat without redness, uvula midline, tonsils absent. Lungs clear to auscultation bilaterally Cardiovascular regular rate and rhythm without a murmur.     Assessment & Plan:

## 2012-02-18 ENCOUNTER — Encounter: Payer: Self-pay | Admitting: Internal Medicine

## 2012-09-02 ENCOUNTER — Other Ambulatory Visit: Payer: Self-pay | Admitting: Obstetrics and Gynecology

## 2012-09-02 DIAGNOSIS — Z1231 Encounter for screening mammogram for malignant neoplasm of breast: Secondary | ICD-10-CM

## 2012-09-28 ENCOUNTER — Ambulatory Visit
Admission: RE | Admit: 2012-09-28 | Discharge: 2012-09-28 | Disposition: A | Discharge status: Home / Self Care | Payer: BC Managed Care – PPO | Source: Ambulatory Visit | Attending: Obstetrics and Gynecology | Admitting: Obstetrics and Gynecology

## 2012-09-28 DIAGNOSIS — Z1231 Encounter for screening mammogram for malignant neoplasm of breast: Secondary | ICD-10-CM

## 2014-05-17 ENCOUNTER — Other Ambulatory Visit: Payer: Self-pay

## 2014-05-17 DIAGNOSIS — Z1231 Encounter for screening mammogram for malignant neoplasm of breast: Secondary | ICD-10-CM

## 2014-05-26 ENCOUNTER — Ambulatory Visit
Admission: RE | Admit: 2014-05-26 | Discharge: 2014-05-26 | Disposition: A | Discharge status: Home / Self Care | Payer: Managed Care, Other (non HMO) | Source: Ambulatory Visit

## 2014-05-26 ENCOUNTER — Encounter (INDEPENDENT_AMBULATORY_CARE_PROVIDER_SITE_OTHER): Payer: Self-pay

## 2014-05-26 DIAGNOSIS — Z1231 Encounter for screening mammogram for malignant neoplasm of breast: Secondary | ICD-10-CM

## 2015-07-14 ENCOUNTER — Other Ambulatory Visit: Payer: Self-pay

## 2015-07-14 DIAGNOSIS — Z1231 Encounter for screening mammogram for malignant neoplasm of breast: Secondary | ICD-10-CM

## 2015-07-20 ENCOUNTER — Ambulatory Visit
Admission: RE | Admit: 2015-07-20 | Discharge: 2015-07-20 | Disposition: A | Discharge status: Home / Self Care | Payer: Managed Care, Other (non HMO) | Source: Ambulatory Visit

## 2015-07-20 DIAGNOSIS — Z1231 Encounter for screening mammogram for malignant neoplasm of breast: Secondary | ICD-10-CM

## 2016-09-11 ENCOUNTER — Encounter (HOSPITAL_BASED_OUTPATIENT_CLINIC_OR_DEPARTMENT_OTHER): Payer: Self-pay

## 2016-09-11 ENCOUNTER — Emergency Department (HOSPITAL_BASED_OUTPATIENT_CLINIC_OR_DEPARTMENT_OTHER)
Admission: EM | Admit: 2016-09-11 | Discharge: 2016-09-11 | Disposition: A | Discharge status: Home / Self Care | Payer: Managed Care, Other (non HMO) | Attending: Emergency Medicine | Admitting: Emergency Medicine

## 2016-09-11 DIAGNOSIS — S81031A Puncture wound without foreign body, right knee, initial encounter: Secondary | ICD-10-CM | POA: Diagnosis not present

## 2016-09-11 DIAGNOSIS — S8991XA Unspecified injury of right lower leg, initial encounter: Secondary | ICD-10-CM | POA: Diagnosis present

## 2016-09-11 DIAGNOSIS — S81831A Puncture wound without foreign body, right lower leg, initial encounter: Secondary | ICD-10-CM | POA: Diagnosis not present

## 2016-09-11 DIAGNOSIS — Y999 Unspecified external cause status: Secondary | ICD-10-CM | POA: Insufficient documentation

## 2016-09-11 DIAGNOSIS — Y929 Unspecified place or not applicable: Secondary | ICD-10-CM | POA: Insufficient documentation

## 2016-09-11 DIAGNOSIS — T148XXA Other injury of unspecified body region, initial encounter: Secondary | ICD-10-CM

## 2016-09-11 DIAGNOSIS — W268XXA Contact with other sharp object(s), not elsewhere classified, initial encounter: Secondary | ICD-10-CM | POA: Diagnosis not present

## 2016-09-11 DIAGNOSIS — Y9389 Activity, other specified: Secondary | ICD-10-CM | POA: Diagnosis not present

## 2016-09-11 MED ORDER — DOXYCYCLINE HYCLATE 100 MG PO CAPS: 100.0000 mg | ORAL_CAPSULE | Freq: Two times a day (BID) | ORAL | 0 refills | Status: AC

## 2016-09-11 MED ORDER — TRAMADOL HCL 50 MG PO TABS: 50.0000 mg | ORAL_TABLET | Freq: Four times a day (QID) | ORAL | 0 refills | Status: AC | PRN

## 2016-09-11 MED ORDER — IBUPROFEN 800 MG PO TABS: 800.0000 mg | ORAL_TABLET | Freq: Three times a day (TID) | ORAL | 0 refills | Status: AC | PRN

## 2016-09-11 NOTE — ED Triage Notes (Signed)
Pt states a rose bush thorn pierced her right knee and lower leg yesterday-c/o pain, redness-slight redness noted to knee and small red dot to knee and to tib/fib-NAD-steady gait

## 2016-09-11 NOTE — ED Provider Notes (Signed)
[facility] Provider Note   CSN: [id number] Arrival date & time: 09/11/16  1311     History   Chief Complaint Chief Complaint  Patient presents with  . Knee Injury    HPI Tara Banks is a 50 y.o. female.  HPI Patient presents to the emergency department with an injury to the right knee and lower leg.  The patient states she was cutting rose bushes when one of the branches with a thorn hindering the knee and right lower leg, states she has had pain and tenderness in this area since that time.  She states that movement hurts the area more.  Patient states she took some Motrin and had a glass of wine last night to alleviate the pain History reviewed. No pertinent past medical history.  Patient Active Problem List   Diagnosis Date Noted  . Cough 02/17/2012  . PEPTIC ULCER, ACUTE, HEMORRHAGE 08/21/2009  . DIVERTICULOSIS, COLON 08/21/2009  . HEADACHE 08/21/2009    Past Surgical History:  Procedure Laterality Date  . ABDOMINAL HYSTERECTOMY      OB History    No data available       Home Medications    Prior to Admission medications   Not on File    Family History No family history on file.  Social History Social History  Substance Use Topics  . Smoking status: Never Smoker  . Smokeless tobacco: Never Used  . Alcohol use Yes     Comment: occ     Allergies   Ciprofloxacin; Penicillins; and Sulfonamide derivatives   Review of Systems Review of Systems  All other systems negative except as documented in the HPI. All pertinent positives and negatives as reviewed in the HPI. Physical Exam Updated Vital Signs BP 113/74 (BP Location: Left Arm)   Pulse 70   Temp 98.6 F (37 C) (Oral)   Resp 18   Ht 5\' 1"  (1.549 m)   Wt 54.4 kg   SpO2 100%   BMI 22.67 kg/m   Physical Exam  Constitutional: She is oriented to person, place, and time. She appears well-developed and well-nourished.  Eyes: Pupils are equal, round, and reactive to light.    Pulmonary/Chest: Effort normal.  Musculoskeletal: Normal range of motion.       Right knee: She exhibits normal range of motion, no swelling, no effusion, no ecchymosis and no deformity.       Legs: Neurological: She is alert and oriented to person, place, and time.     ED Treatments / Results  Labs (all labs ordered are listed, but only abnormal results are displayed) Labs Reviewed - No data to display  EKG  EKG Interpretation None       Radiology No results found.  Procedures Procedures (including critical care time)  Medications Ordered in ED Medications - No data to display   Initial Impression / Assessment and Plan / ED Course  I have reviewed the triage vital signs and the nursing notes.  Pertinent labs & imaging results that were available during my care of the patient were reviewed by me and considered in my medical decision making (see chart for details).  Clinical Course   Patient be treated for any cellulitis that may be forming.  Told to use ice over the areas.  Keep areas clean and dry.  Follow-up with her primary care doctor  Final Clinical Impressions(s) / ED Diagnoses   Final diagnoses:  None    New Prescriptions New Prescriptions  No medications on file     Charlestine NightChristopher Hollace Michelli, PA-C 09/11/16 1555    [address] CircleChristopher Tzirel Leonor, PA-C 09/11/16 1556    Maia PlanJoshua G Long, MD 09/12/16 [phone]

## 2016-09-11 NOTE — ED Notes (Signed)
Pt directed to pharmacy to pick up prescriptions-  

## 2016-09-11 NOTE — ED Notes (Signed)
PA at bedside.

## 2016-09-11 NOTE — ED Notes (Signed)
Pt states she was working outside cutting rose bushes and one came down on her RLE. Puncture wound to right knee and shin area. Increased pain and difficulty walking today. Some erythema and bruising noted.

## 2019-11-08 ENCOUNTER — Other Ambulatory Visit: Payer: Self-pay | Admitting: Obstetrics and Gynecology

## 2019-11-08 DIAGNOSIS — R928 Other abnormal and inconclusive findings on diagnostic imaging of breast: Secondary | ICD-10-CM

## 2019-11-09 ENCOUNTER — Ambulatory Visit
Admission: RE | Admit: 2019-11-09 | Discharge: 2019-11-09 | Disposition: A | Discharge status: Home / Self Care | Payer: No Typology Code available for payment source | Source: Ambulatory Visit | Attending: Obstetrics and Gynecology | Admitting: Obstetrics and Gynecology

## 2019-11-09 ENCOUNTER — Other Ambulatory Visit: Payer: Self-pay

## 2019-11-09 ENCOUNTER — Other Ambulatory Visit: Payer: Self-pay | Admitting: Obstetrics and Gynecology

## 2019-11-09 ENCOUNTER — Ambulatory Visit
Admission: RE | Admit: 2019-11-09 | Discharge: 2019-11-09 | Disposition: A | Discharge status: Home / Self Care | Payer: Managed Care, Other (non HMO) | Source: Ambulatory Visit | Attending: Obstetrics and Gynecology | Admitting: Obstetrics and Gynecology

## 2019-11-09 DIAGNOSIS — R928 Other abnormal and inconclusive findings on diagnostic imaging of breast: Secondary | ICD-10-CM

## 2020-01-17 ENCOUNTER — Other Ambulatory Visit: Payer: Self-pay

## 2020-01-17 ENCOUNTER — Ambulatory Visit: Payer: No Typology Code available for payment source | Attending: Internal Medicine

## 2020-01-17 DIAGNOSIS — Z20822 Contact with and (suspected) exposure to covid-19: Secondary | ICD-10-CM

## 2020-01-18 LAB — NOVEL CORONAVIRUS, NAA: SARS-CoV-2, NAA: NOT DETECTED

## 2020-03-06 ENCOUNTER — Ambulatory Visit: Payer: BLUE CROSS/BLUE SHIELD

## 2020-03-27 ENCOUNTER — Ambulatory Visit: Payer: BLUE CROSS/BLUE SHIELD | Attending: Internal Medicine

## 2020-03-27 DIAGNOSIS — Z23 Encounter for immunization: Secondary | ICD-10-CM

## 2020-03-27 NOTE — Progress Notes (Signed)
   Covid-19 Vaccination Clinic  Name:  Jamacia Jester    MRN: [id number] DOB: 1966/01/13  03/27/2020  Ms. Brinkman was observed post Covid-19 immunization for 15 minutes without incident. She was provided with Vaccine Information Sheet and instruction to access the V-Safe system.   Ms. Kemmerer was instructed to call 911 with any severe reactions post vaccine: Marland Kitchen Difficulty breathing  . Swelling of face and throat  . A fast heartbeat  . A bad rash all over body  . Dizziness and weakness   Immunizations Administered    Name Date Dose VIS Date Route   Pfizer COVID-19 Vaccine 03/27/2020  9:50 AM 0.3 mL 12/03/2019 Intramuscular   Manufacturer: [employer], [employer]   Lot: [id number]   NDC: [address]-1

## 2020-04-17 ENCOUNTER — Ambulatory Visit: Payer: BLUE CROSS/BLUE SHIELD | Attending: Internal Medicine

## 2020-04-17 DIAGNOSIS — Z23 Encounter for immunization: Secondary | ICD-10-CM

## 2020-04-17 NOTE — Progress Notes (Signed)
   Covid-19 Vaccination Clinic  Name:  Tara Banks    MRN: [id number] DOB: 1966-12-08  04/17/2020  [name] was observed post Covid-19 immunization for 30 minutes based on pre-vaccination screening without incident. She was provided with Vaccine Information Sheet and instruction to access the V-Safe system.   [name] was instructed to call 911 with any severe reactions post vaccine: Marland Kitchen Difficulty breathing  . Swelling of face and throat  . A fast heartbeat  . A bad rash all over body  . Dizziness and weakness   Immunizations Administered    Name Date Dose VIS Date Route   Pfizer COVID-19 Vaccine 04/17/2020  8:16 AM 0.3 mL 02/16/2019 Intramuscular   Manufacturer: [employer], [employer]   Lot: [id number]   NDC: [address]-7

## 2023-01-07 ENCOUNTER — Other Ambulatory Visit: Payer: Self-pay | Admitting: Obstetrics and Gynecology

## 2023-01-07 DIAGNOSIS — Z1231 Encounter for screening mammogram for malignant neoplasm of breast: Secondary | ICD-10-CM

## 2023-02-28 ENCOUNTER — Ambulatory Visit
Admission: RE | Admit: 2023-02-28 | Discharge: 2023-02-28 | Disposition: A | Discharge status: Home / Self Care | Payer: Managed Care, Other (non HMO) | Source: Ambulatory Visit | Attending: Obstetrics and Gynecology | Admitting: Obstetrics and Gynecology

## 2023-02-28 DIAGNOSIS — Z1231 Encounter for screening mammogram for malignant neoplasm of breast: Secondary | ICD-10-CM

## 2024-08-02 ENCOUNTER — Other Ambulatory Visit: Payer: Self-pay | Admitting: Obstetrics and Gynecology

## 2024-08-02 DIAGNOSIS — Z1231 Encounter for screening mammogram for malignant neoplasm of breast: Secondary | ICD-10-CM

## 2024-08-03 ENCOUNTER — Telehealth: Payer: Self-pay | Admitting: Gastroenterology

## 2024-08-03 NOTE — Telephone Encounter (Addendum)
 Good afternoon Dr. Charlanne,  Patient requesting to be seen for colonoscopy. Last had colonoscopy in 2020 with Dr. Rosalie. States she is no longer happy with care provided and would like to transfer her care specifically over to you. Patient's records are in Epic media for you to review and advise on scheduling further.   Thank you

## 2024-08-09 NOTE — Telephone Encounter (Signed)
 Good Morning Dr Charlanne    Patient calling to f/u in regurds to transfer. Please advise.   Thank you

## 2024-08-10 NOTE — Telephone Encounter (Signed)
 OK to schedule in my clinic or APP, which ever is earlier RG

## 2024-08-11 ENCOUNTER — Encounter: Payer: Self-pay | Admitting: Gastroenterology

## 2024-08-11 NOTE — Telephone Encounter (Signed)
Patient scheduled for ov with provider.

## 2024-08-18 ENCOUNTER — Ambulatory Visit
Admission: RE | Admit: 2024-08-18 | Discharge: 2024-08-18 | Disposition: A | Discharge status: Home / Self Care | Source: Ambulatory Visit | Attending: Obstetrics and Gynecology | Admitting: Obstetrics and Gynecology

## 2024-08-18 DIAGNOSIS — Z1231 Encounter for screening mammogram for malignant neoplasm of breast: Secondary | ICD-10-CM

## 2024-11-12 ENCOUNTER — Encounter: Payer: Self-pay | Admitting: Gastroenterology

## 2024-11-12 ENCOUNTER — Ambulatory Visit: Admitting: Gastroenterology

## 2024-11-12 VITALS — BP 112/64 | HR 74 | Ht 61.0 in | Wt 117.0 lb

## 2024-11-12 DIAGNOSIS — K449 Diaphragmatic hernia without obstruction or gangrene: Secondary | ICD-10-CM | POA: Diagnosis not present

## 2024-11-12 DIAGNOSIS — Z83719 Family history of colon polyps, unspecified: Secondary | ICD-10-CM

## 2024-11-12 DIAGNOSIS — K581 Irritable bowel syndrome with constipation: Secondary | ICD-10-CM | POA: Diagnosis not present

## 2024-11-12 DIAGNOSIS — K219 Gastro-esophageal reflux disease without esophagitis: Secondary | ICD-10-CM | POA: Diagnosis not present

## 2024-11-12 MED ORDER — NA SULFATE-K SULFATE-MG SULF 17.5-3.13-1.6 GM/177ML PO SOLN: 1.0000 | Freq: Once | ORAL | 0 refills | Status: AC

## 2024-11-12 NOTE — Progress Notes (Signed)
 Chief Complaint: To get established and for colonoscopy  Referring Provider:  No ref. provider found      ASSESSMENT AND PLAN;   #1. GERD with small HH #2. H/O diverticulitis s/p remote sigmoid resection followed by incisional hernia repair #3. IBS-C with bloating. Nl TSH #4. FH polyps (dad < 60)  Plan: -Colon -Continue miralax 17g PO every day. -Celiac serology, CRP. -If any problems in future, would proceed with CT scan abdo/pelvis.    HPI:     History of Present Illness Tara Banks is a 58 year old female with a history of diverticulitis and sigmoid resection who presents for evaluation of gastrointestinal symptoms and colonoscopy follow-up.  She has a history of diverticulitis, leading to a sigmoid resection around 2010. She experiences alternating diarrhea and constipation, managed with daily MiraLAX. Previously, she had severe constipation, with bowel movements delayed up to 10-12 days. Recently, she experienced pain and bloating, initially suspected to be ovarian in origin, prompting a colonoscopy. Her last colonoscopy in November 2020 was performed after she experienced pain and bloating, and she recalls a history of benign polyps and melanosis coli. She also has hemorrhoids and diverticulosis with a few pockets remaining.  Feels much better now.  She has undergone several hernia repairs, including an incarcerated hernia post-colon resection.   She experiences heartburn, exacerbated by certain foods like spicy dishes and steak, managed with over-the-counter omeprazole and dietary modifications. An endoscopy in 2013 revealed a small hiatal hernia and a benign Schatzki's ring. She reports occasional regurgitation and food impaction in the throat.  She follows a mostly gluten-free diet, which she finds beneficial for her symptoms. She was previously tested for celiac disease about 15 years ago, but the results were inconclusive. She reports bloating triggered by  certain foods, particularly spicy and greasy foods.  Her family history includes her father's sister having colon cancer and her father having polyps and severe IBS. She is concerned about her gastrointestinal health due to her family's history of stomach issues.  No blood in stool, occasional epistaxis attributed to dry air, and no significant gum bleeding. She reports weight loss during her daughter's illness, which she attributes to stress.  No fever chills or night sweats.  Results DIAGNOSTIC Colonoscopy: Negative for precancerous polyps, presence of melanosis, hemorrhoids, and a tiny polyp (11/22/2019). Rpt 5 yrs (Dr Magod) d/t FH polyps Endoscopy: Small hiatal hernia, benign Schatzki's ring, duodenitis, normal larynx (2013)  PATHOLOGY Biopsy: Presence of melanosis (11/22/2019), neg Bx   Past Medical History:  Diagnosis Date   Abdominal pain     Past Surgical History:  Procedure Laterality Date   ABDOMINAL HYSTERECTOMY     COLON RESECTION     HYSTERECTOMY, VAGINAL, WITH SALPINGECTOMY      Family History  Problem Relation Age of Onset   Breast cancer Mother    Prostate cancer Father    Irritable bowel syndrome Father     Social History   Tobacco Use   Smoking status: Never   Smokeless tobacco: Never  Vaping Use   Vaping status: Never Used  Substance Use Topics   Alcohol use: Yes    Comment: occ   Drug use: No    Current Outpatient Medications  Medication Sig Dispense Refill   estradiol (ESTRACE) 2 MG tablet Take 2 tablets every day by oral route.     Omega-3 Fatty Acids (FISH OIL) 300 MG CAPS Take by mouth.     doxycycline  (VIBRAMYCIN ) 100 MG capsule Take  1 capsule (100 mg total) by mouth 2 (two) times daily. 20 capsule 0   ibuprofen  (ADVIL ,MOTRIN ) 800 MG tablet Take 1 tablet (800 mg total) by mouth every 8 (eight) hours as needed. 21 tablet 0   traMADol  (ULTRAM ) 50 MG tablet Take 1 tablet (50 mg total) by mouth every 6 (six) hours as needed for severe pain.  15 tablet 0   No current facility-administered medications for this visit.    Allergies  Allergen Reactions   Ciprofloxacin    Penicillins    Sulfonamide Derivatives     Review of Systems:  Constitutional: Denies fever, chills, diaphoresis, appetite change and fatigue.  HEENT: Denies photophobia, eye pain, redness, hearing loss, ear pain, congestion, sore throat, rhinorrhea, sneezing, mouth sores, neck pain, neck stiffness and tinnitus.   Respiratory: Denies SOB, DOE, cough, chest tightness,  and wheezing.   Cardiovascular: Denies chest pain, palpitations and leg swelling.  Genitourinary: Denies dysuria, urgency, frequency, hematuria, flank pain and difficulty urinating.  Musculoskeletal: Denies myalgias, back pain, joint swelling, arthralgias and gait problem.  Skin: No rash.  Neurological: Denies dizziness, seizures, syncope, weakness, light-headedness, numbness and headaches.  Hematological: Denies adenopathy. Easy bruising, personal or family bleeding history  Psychiatric/Behavioral: No anxiety or depression     Physical Exam:    BP 112/64   Pulse 74   Ht 5' 1 (1.549 m)   Wt 117 lb (53.1 kg)   BMI 22.11 kg/m  Wt Readings from Last 3 Encounters:  11/12/24 117 lb (53.1 kg)  09/11/16 120 lb (54.4 kg)  02/17/12 114 lb (51.7 kg)   Constitutional:  Well-developed, in no acute distress. Psychiatric: Normal mood and affect. Behavior is normal. HEENT: Pupils normal.  Conjunctivae are normal. No scleral icterus. Cardiovascular: Normal rate, regular rhythm. No edema Pulmonary/chest: Effort normal and breath sounds normal. No wheezing, rales or rhonchi. Abdominal: Soft, nondistended. Nontender. Bowel sounds active throughout. There are no masses palpable. No hepatomegaly. Rectal: Deferred Neurological: Alert and oriented to person place and time. Skin: Skin is warm and dry. No rashes noted.  Data Reviewed: I have personally reviewed following labs and imaging  studies  CBC:    Latest Ref Rng & Units 03/21/2010    3:21 PM 08/21/2009   11:37 AM 06/07/2009    5:40 AM  CBC  WBC 4.5 - 10.5 10*3/microliter  5.0  5.9   Hemoglobin g/dL 86.5  86.3  87.9   Hematocrit % 41.1  39.9  34.9   Platelets 10*3/mm3 332  356.0  271     CMP:    Latest Ref Rng & Units 03/21/2010    3:21 PM 08/21/2009   11:37 AM 06/07/2009    5:40 AM  CMP  Glucose mg/dL 96  89  93   BUN mg/dL 13  17  4    Creatinine mg/dL 9.29  0.6  9.40   Sodium 135 - 145 meq/L  139  140 REPEATED TO VERIFY   Potassium 3.5 - 5.1 meq/L  3.9  3.7 REPEATED TO VERIFY   Chloride 96 - 112 meq/L  105  110 REPEATED TO VERIFY   CO2 19 - 32 meq/L  30  27 REPEATED TO VERIFY   Calcium mg/dL 9.5  9.2  8.7   Total Protein g/dL 6.8     Total Bilirubin mg/dL 0.7     AST units/L 21     ALT units/L 17           Anselm Bring, MD 11/12/2024, 3:57 PM  Cc: No ref. provider found

## 2024-11-12 NOTE — Patient Instructions (Addendum)
 _______________________________________________________  If your blood pressure at your visit was 140/90 or greater, please contact your primary care physician to follow up on this.  _______________________________________________________  If you are age 56 or older, your body mass index should be between 23-30. Your Body mass index is 22.11 kg/m. If this is out of the aforementioned range listed, please consider follow up with your Primary Care Provider.  If you are age 15 or younger, your body mass index should be between 19-25. Your Body mass index is 22.11 kg/m. If this is out of the aformentioned range listed, please consider follow up with your Primary Care Provider.   ________________________________________________________  The [facility] providers would like to encourage you to use MYCHART to communicate with providers for non-urgent requests or questions.  Due to long hold times on the telephone, sending your provider a message by [facility] may be a faster and more efficient way to get a response.  Please allow 48 business hours for a response.  Please remember that this is for non-urgent requests.  _______________________________________________________  [facility] is using a team-based approach to care.  Your team is made up of your doctor and two to three APPS. Our APPS (Nurse Practitioners and Physician Assistants) work with your physician to ensure care continuity for you. They are fully qualified to address your health concerns and develop a treatment plan. They communicate directly with your gastroenterologist to care for you. Seeing the Advanced Practice Practitioners on your physician's team can help you by facilitating care more promptly, often allowing for earlier appointments, access to diagnostic testing, procedures, and other specialty referrals.   Your provider has requested that you go to the basement level for lab work before leaving today. Press B on the  elevator. The lab is located at the first door on the left as you exit the elevator.  We have sent the following medications to your pharmacy for you to pick up at your convenience: Suprep  You have been scheduled for a colonoscopy. Please follow written instructions given to you at your visit today.   If you use inhalers (even only as needed), please bring them with you on the day of your procedure.  DO NOT TAKE 7 DAYS PRIOR TO TEST- Trulicity (dulaglutide) Ozempic, Wegovy (semaglutide) Mounjaro, Zepbound (tirzepatide) Bydureon Bcise (exanatide extended release)  DO NOT TAKE 1 DAY PRIOR TO YOUR TEST Rybelsus (semaglutide) Adlyxin (lixisenatide) Victoza (liraglutide) Byetta (exanatide) ___________________________________________________________________________   Thank you,  Dr. Lynnie Bring

## 2025-01-10 ENCOUNTER — Encounter: Payer: Self-pay | Admitting: Gastroenterology

## 2025-01-14 ENCOUNTER — Telehealth: Payer: Self-pay | Admitting: *Deleted

## 2025-01-14 DIAGNOSIS — K581 Irritable bowel syndrome with constipation: Secondary | ICD-10-CM

## 2025-01-14 DIAGNOSIS — Z83719 Family history of colon polyps, unspecified: Secondary | ICD-10-CM

## 2025-01-14 NOTE — Telephone Encounter (Signed)
 Spoke with to reschedule appt on 01-17-25 d/t weather.  Rescheduled colonoscopy to 01-31-25 at 2:00 pm.  New instructions sent via MyChart

## 2025-01-17 ENCOUNTER — Encounter: Admitting: Gastroenterology

## 2025-01-31 ENCOUNTER — Encounter: Admitting: Gastroenterology
